# Patient Record
Sex: Female | Born: 1975 | Race: White | Hispanic: No | Marital: Married | Smoking: Former smoker
Health system: Southern US, Community
[De-identification: ages and names within clinical notes are randomized; demographics above are authoritative.]

## PROBLEM LIST (undated history)

## (undated) ENCOUNTER — Emergency Department (HOSPITAL_BASED_OUTPATIENT_CLINIC_OR_DEPARTMENT_OTHER): Admission: EM | Payer: BC Managed Care – PPO | Source: Home / Self Care

## (undated) DIAGNOSIS — F32A Depression, unspecified: Secondary | ICD-10-CM

## (undated) DIAGNOSIS — Z9889 Other specified postprocedural states: Secondary | ICD-10-CM

## (undated) DIAGNOSIS — F329 Major depressive disorder, single episode, unspecified: Secondary | ICD-10-CM

## (undated) HISTORY — PX: NOSE SURGERY: SHX723

## (undated) HISTORY — PX: CARPAL TUNNEL RELEASE: SHX101

## (undated) HISTORY — PX: BREAST REDUCTION SURGERY: SHX8

---

## 1998-06-06 ENCOUNTER — Other Ambulatory Visit: Admission: RE | Admit: 1998-06-06 | Discharge: 1998-06-06 | Payer: Self-pay | Admitting: *Deleted

## 1999-07-12 ENCOUNTER — Ambulatory Visit (HOSPITAL_COMMUNITY): Admission: RE | Admit: 1999-07-12 | Discharge: 1999-07-12 | Payer: Self-pay | Admitting: Family Medicine

## 1999-07-12 ENCOUNTER — Encounter: Payer: Self-pay | Admitting: Family Medicine

## 2000-07-23 ENCOUNTER — Other Ambulatory Visit: Admission: RE | Admit: 2000-07-23 | Discharge: 2000-07-23 | Payer: Self-pay | Admitting: *Deleted

## 2000-07-29 ENCOUNTER — Encounter: Admission: RE | Admit: 2000-07-29 | Discharge: 2000-07-29 | Payer: Self-pay | Admitting: *Deleted

## 2000-07-29 ENCOUNTER — Encounter: Payer: Self-pay | Admitting: *Deleted

## 2001-09-09 ENCOUNTER — Other Ambulatory Visit: Admission: RE | Admit: 2001-09-09 | Discharge: 2001-09-09 | Payer: Self-pay | Admitting: *Deleted

## 2002-05-09 ENCOUNTER — Ambulatory Visit (HOSPITAL_BASED_OUTPATIENT_CLINIC_OR_DEPARTMENT_OTHER): Admission: RE | Admit: 2002-05-09 | Discharge: 2002-05-10 | Payer: Self-pay | Admitting: Plastic Surgery

## 2002-05-09 ENCOUNTER — Encounter (INDEPENDENT_AMBULATORY_CARE_PROVIDER_SITE_OTHER): Payer: Self-pay | Admitting: *Deleted

## 2002-10-31 ENCOUNTER — Other Ambulatory Visit: Admission: RE | Admit: 2002-10-31 | Discharge: 2002-10-31 | Payer: Self-pay | Admitting: *Deleted

## 2003-08-17 ENCOUNTER — Ambulatory Visit (HOSPITAL_COMMUNITY): Admission: RE | Admit: 2003-08-17 | Discharge: 2003-08-17 | Payer: Self-pay | Admitting: Internal Medicine

## 2003-12-15 ENCOUNTER — Other Ambulatory Visit: Admission: RE | Admit: 2003-12-15 | Discharge: 2003-12-15 | Payer: Self-pay | Admitting: Obstetrics and Gynecology

## 2004-09-08 ENCOUNTER — Inpatient Hospital Stay (HOSPITAL_COMMUNITY): Admission: AD | Admit: 2004-09-08 | Discharge: 2004-09-10 | Payer: Self-pay | Admitting: Obstetrics and Gynecology

## 2005-02-17 ENCOUNTER — Other Ambulatory Visit: Admission: RE | Admit: 2005-02-17 | Discharge: 2005-02-17 | Payer: Self-pay | Admitting: Obstetrics and Gynecology

## 2007-11-12 ENCOUNTER — Other Ambulatory Visit: Admission: RE | Admit: 2007-11-12 | Discharge: 2007-11-12 | Payer: Self-pay | Admitting: Family Medicine

## 2010-09-20 NOTE — Op Note (Signed)
NAMEZOSIA, LUCCHESE                         ACCOUNT NO.:  [id number]   MEDICAL RECORD NO.:  [id number]                   PATIENT TYPE:  AMB   LOCATION:  DSC                                  FACILITY:  [facility]   PHYSICIAN:  Brantley Persons, M.D.             DATE OF BIRTH:  01/28/1976   DATE OF PROCEDURE:  05/09/2002  DATE OF DISCHARGE:                                 OPERATIVE REPORT   PREOPERATIVE DIAGNOSIS:  Bilateral macromastia.   POSTOPERATIVE DIAGNOSIS:  Bilateral macromastia.   OPERATION:  Bilateral reduction mammoplasty.   SURGEON:  Brantley Persons, M.D.   ASSISTANT:  Bing Neighbors. Clearance Coots, M.D.   ANESTHESIA:  General endotracheal   ANESTHESIOLOGIST:  Sheldon Silvan, M.D.   ESTIMATED BLOOD LOSS:  200 cc   FLUID REPLACEMENT:  2800 cc crystalloid.   URINE OUTPUT:  200 cc   COMPLICATIONS:  None   INDICATIONS FOR PROCEDURE:  The patient is a 35 year old Caucasian female  with bilateral macromastia which is essentially symptomatic. She complains  of neck aches, back aches, headaches, shoulder strap groove marks and pain  along with intertriginous skin rashes.  She presents to undergo bilateral  reduction mammoplasty.  She is to be taken for overnight stay for  progressive pain control along with ambulation and monitoring of the nipples  and breasts flaps.   DESCRIPTION OF PROCEDURE:  The patient was marked in the preoperative  holding area in a pattern of Wise for the future bilateral reduction  mammoplasties.  She was then taken back to the operating room and placed on  the table in the supine position.  After adequate general endotracheal  anesthesia was obtained, the patient's chest was prepped with Betadine and  alcohol and draped in a sterile fashion.  The base of the breasts were then  injected with 1% Lidocaine with epinephrine.  After adequate hemostasis had  taken effect, the procedure was begun.   First the left breast reduction was performed since this  was the much  smaller breast.  The nipple was marked with the 42 mm nipple marker.  The  skin was then incised around the nipple and de-epithelized down to the  inframammary crease in inferior pedicle pattern.  Next, the medial, superior  and lateral skin flaps were elevated were elevated down to the chest wall.  The excess fat and glandular tissue were removed from the inferior pedicle.  The nipple was then examined and found to be pink and viable.  The wound was  irrigated with saline irrigation.  Meticulous hemostasis was obtained with  the Bovie electrocautery.  The inferior pedicle was centralized using 3-0  Prolene suture.  A #10 JP flap fully fluted drain was then placed into the  wound.  The skin flaps were brought together and inverted T junction with 2-  0 Prolene suture.  The incision was then stapled for temporary closure.   Attention was then turned to  the right breast.  This was the much larger  breast.  The nipple was marked with 42 mm nipple marker.  The skin was then  incised around the nipple and de-epithelized down to inframammary crease in  inferior pedicle pattern.  Next the medial, superior and lateral skin flaps  were then elevated down to the chest wall.  The excess fat and glandular  tissue were removed from the inferior pedicle.  The nipple was then examined  and found to be pink and viable.  The wound was irrigated with saline  irrigation.  Meticulous hemostasis was obtained with the Bovie  electrocautery.  The inferior pedicle was centralized using 3-0 Prolene  suture.  A #10 JP flap fully fluted drain was then placed into the wound.  The skin flap was brought together in inverted T junction with a 2-0 Prolene  suture.  The incision was then stapled for temporary closure.   The breasts were then compared and found to have good shape and symmetry.  All the staples were removed and the incisions closed using 3-0 Monocryl in  the dermal layer followed by 4-0  Monocryl in an intracuticular stitch on the  skin.  The patient was then placed in the upright position.  The location of  the nipples was marked on each breast mound using the 38 mm nipple marker.  She was then placed back into the recumbent position.   First the future nipple areolar complex was incised on the left breast  mound.  The tissue was excised full thickness.  The nipple was then examined  and found to be pink and viable, brought out into this aperture and sewn in  place using 4-0 Monocryl in the dermal layer followed by 5-0 Monocryl in an  intracuticular stitch on the skin.  In a likewise fashion, the nipple  areolar complex was incised on the right breast mound.  This tissue was  excised full thickness.  The nipple was then examined and found to be pink  and viable and brought out into this aperture and sewn in place using 4-0  Monocryl in the dermal layer followed by 5-0 Monocryl in an intracuticular  stitch on the skin.  The JP drains were then sewn in place using 3-0 Nylon  suture.  The incisions were dressed with Benzoin and Steri-Strips and the  nipples additionally with Bacitracin ointment and Adaptic.  4 x 4s were then  placed over all the incisions and the patient was placed in a light  postoperative support bra.  There were no complications.  The patient  tolerated the procedure well.  The final needle and sponge counts were  reported to be correct at the end case.  The patient was then awakened from  general anesthesia and taken to the recovery room in stable condition. She  will be admitted overnight to the [facility] for progressive pain control along  with ambulation, monitoring of the nipples and breasts flaps.  Discharge is  planned for the morning.   AMOUNTS OF BREAST TISSUE REMOVED:  Right breast is 1313 grams.  Left breast  942 grams.                                              Brantley Persons, M.D.    MC/MEDQ  D:  05/09/2002  T:  05/09/2002  Job:   [id number]  cc:   Bing Neighbors. Clearance Coots, M.D.  [address]  [place]  [place] [address]  Fax: [phone]

## 2013-01-03 ENCOUNTER — Emergency Department (HOSPITAL_COMMUNITY)
Admission: EM | Admit: 2013-01-03 | Discharge: 2013-01-04 | Disposition: A | Discharge status: Home / Self Care | Payer: BC Managed Care – PPO | Attending: Emergency Medicine | Admitting: Emergency Medicine

## 2013-01-03 ENCOUNTER — Encounter (HOSPITAL_COMMUNITY): Payer: Self-pay

## 2013-01-03 DIAGNOSIS — IMO0002 Reserved for concepts with insufficient information to code with codable children: Secondary | ICD-10-CM | POA: Insufficient documentation

## 2013-01-03 DIAGNOSIS — Z87891 Personal history of nicotine dependence: Secondary | ICD-10-CM | POA: Insufficient documentation

## 2013-01-03 DIAGNOSIS — IMO0001 Reserved for inherently not codable concepts without codable children: Secondary | ICD-10-CM | POA: Insufficient documentation

## 2013-01-03 DIAGNOSIS — Z23 Encounter for immunization: Secondary | ICD-10-CM | POA: Insufficient documentation

## 2013-01-03 DIAGNOSIS — F329 Major depressive disorder, single episode, unspecified: Secondary | ICD-10-CM | POA: Insufficient documentation

## 2013-01-03 DIAGNOSIS — Y9289 Other specified places as the place of occurrence of the external cause: Secondary | ICD-10-CM | POA: Insufficient documentation

## 2013-01-03 DIAGNOSIS — Z79899 Other long term (current) drug therapy: Secondary | ICD-10-CM | POA: Insufficient documentation

## 2013-01-03 DIAGNOSIS — Z9889 Other specified postprocedural states: Secondary | ICD-10-CM | POA: Insufficient documentation

## 2013-01-03 DIAGNOSIS — Y9339 Activity, other involving climbing, rappelling and jumping off: Secondary | ICD-10-CM | POA: Insufficient documentation

## 2013-01-03 DIAGNOSIS — T148XXA Other injury of unspecified body region, initial encounter: Secondary | ICD-10-CM

## 2013-01-03 DIAGNOSIS — F3289 Other specified depressive episodes: Secondary | ICD-10-CM | POA: Insufficient documentation

## 2013-01-03 HISTORY — DX: Depression, unspecified: F32.A

## 2013-01-03 HISTORY — DX: Other specified postprocedural states: Z98.890

## 2013-01-03 HISTORY — DX: Major depressive disorder, single episode, unspecified: F32.9

## 2013-01-03 MED ORDER — RABIES VACCINE, PCEC IM SUSR
1.0000 mL | Freq: Once | INTRAMUSCULAR | Status: AC
  Administered 2013-01-04: 1 mL via INTRAMUSCULAR
  Filled 2013-01-03: qty 1

## 2013-01-03 MED ORDER — TETANUS-DIPHTH-ACELL PERTUSSIS 5-2.5-18.5 LF-MCG/0.5 IM SUSP
0.5000 mL | Freq: Once | INTRAMUSCULAR | Status: AC
  Administered 2013-01-04: 0.5 mL via INTRAMUSCULAR
  Filled 2013-01-03: qty 0.5

## 2013-01-03 MED ORDER — RABIES IMMUNE GLOBULIN 150 UNIT/ML IM INJ
20.0000 [IU]/kg | INJECTION | Freq: Once | INTRAMUSCULAR | Status: AC
  Administered 2013-01-04: 1875 [IU]
  Filled 2013-01-03: qty 12.5
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
## 2013-01-04 MED ORDER — FLUCONAZOLE 150 MG PO TABS: 150.0000 mg | ORAL_TABLET | Freq: Once | ORAL | Status: DC

## 2013-01-04 MED ORDER — AMOXICILLIN-POT CLAVULANATE 875-125 MG PO TABS: 1.0000 | ORAL_TABLET | Freq: Two times a day (BID) | ORAL | Status: DC

## 2013-01-05 NOTE — ED Provider Notes (Signed)
Medical screening examination/treatment/procedure(s) were performed by non-physician practitioner and as supervising physician I was immediately available for consultation/collaboration.   Richardean Canal, MD 01/05/13 [phone]

## 2013-01-07 ENCOUNTER — Encounter (HOSPITAL_COMMUNITY): Payer: Self-pay | Admitting: Emergency Medicine

## 2013-01-07 ENCOUNTER — Emergency Department (HOSPITAL_COMMUNITY)
Admission: EM | Admit: 2013-01-07 | Discharge: 2013-01-07 | Disposition: A | Discharge status: Home / Self Care | Payer: BC Managed Care – PPO | Source: Home / Self Care

## 2013-01-07 MED ORDER — RABIES VACCINE, PCEC IM SUSR
1.0000 mL | Freq: Once | INTRAMUSCULAR | Status: AC
  Administered 2013-01-07: 1 mL via INTRAMUSCULAR

## 2013-01-07 MED ORDER — RABIES VACCINE, PCEC IM SUSR
INTRAMUSCULAR | Status: AC
  Filled 2013-01-07: qty 1

## 2013-01-07 NOTE — ED Notes (Signed)
Pt here for rabies injection only. Pt voices no concerns.

## 2013-01-11 ENCOUNTER — Encounter (HOSPITAL_COMMUNITY): Payer: Self-pay | Admitting: Emergency Medicine

## 2013-01-11 ENCOUNTER — Emergency Department (HOSPITAL_COMMUNITY)
Admission: EM | Admit: 2013-01-11 | Discharge: 2013-01-11 | Disposition: A | Discharge status: Home / Self Care | Payer: BC Managed Care – PPO | Source: Home / Self Care

## 2013-01-11 DIAGNOSIS — Z203 Contact with and (suspected) exposure to rabies: Secondary | ICD-10-CM

## 2013-01-11 MED ORDER — RABIES VACCINE, PCEC IM SUSR
1.0000 mL | Freq: Once | INTRAMUSCULAR | Status: AC
  Administered 2013-01-11: 1 mL via INTRAMUSCULAR

## 2013-01-11 MED ORDER — RABIES VACCINE, PCEC IM SUSR
INTRAMUSCULAR | Status: AC
  Filled 2013-01-11: qty 1

## 2013-01-11 NOTE — ED Notes (Signed)
Patient in department for rabies

## 2013-01-19 ENCOUNTER — Encounter (HOSPITAL_COMMUNITY): Payer: Self-pay | Admitting: Emergency Medicine

## 2013-01-19 ENCOUNTER — Emergency Department (HOSPITAL_COMMUNITY)
Admission: EM | Admit: 2013-01-19 | Discharge: 2013-01-19 | Disposition: A | Discharge status: Home / Self Care | Payer: BC Managed Care – PPO | Source: Home / Self Care

## 2013-01-19 DIAGNOSIS — Z203 Contact with and (suspected) exposure to rabies: Secondary | ICD-10-CM

## 2013-01-19 MED ORDER — RABIES VACCINE, PCEC IM SUSR
1.0000 mL | Freq: Once | INTRAMUSCULAR | Status: AC
  Administered 2013-01-19: 1 mL via INTRAMUSCULAR

## 2013-01-19 MED ORDER — RABIES VACCINE, PCEC IM SUSR
INTRAMUSCULAR | Status: AC
  Filled 2013-01-19: qty 1

## 2013-01-19 NOTE — ED Notes (Signed)
Pt is here for 4th rabies vaccination Voices no new concerns Alert w/no signs of acute distress.  

## 2015-07-25 ENCOUNTER — Emergency Department (HOSPITAL_BASED_OUTPATIENT_CLINIC_OR_DEPARTMENT_OTHER)
Admission: EM | Admit: 2015-07-25 | Discharge: 2015-07-26 | Disposition: A | Discharge status: Home / Self Care | Payer: Self-pay | Attending: Emergency Medicine | Admitting: Emergency Medicine

## 2015-07-25 ENCOUNTER — Encounter (HOSPITAL_BASED_OUTPATIENT_CLINIC_OR_DEPARTMENT_OTHER): Payer: Self-pay | Admitting: Emergency Medicine

## 2015-07-25 DIAGNOSIS — M7071 Other bursitis of hip, right hip: Secondary | ICD-10-CM | POA: Insufficient documentation

## 2015-07-25 DIAGNOSIS — R911 Solitary pulmonary nodule: Secondary | ICD-10-CM | POA: Insufficient documentation

## 2015-07-25 DIAGNOSIS — K5900 Constipation, unspecified: Secondary | ICD-10-CM | POA: Insufficient documentation

## 2015-07-25 DIAGNOSIS — Y9389 Activity, other specified: Secondary | ICD-10-CM | POA: Insufficient documentation

## 2015-07-25 DIAGNOSIS — Z79899 Other long term (current) drug therapy: Secondary | ICD-10-CM | POA: Insufficient documentation

## 2015-07-25 DIAGNOSIS — F329 Major depressive disorder, single episode, unspecified: Secondary | ICD-10-CM | POA: Insufficient documentation

## 2015-07-25 DIAGNOSIS — Z3202 Encounter for pregnancy test, result negative: Secondary | ICD-10-CM | POA: Insufficient documentation

## 2015-07-25 DIAGNOSIS — K625 Hemorrhage of anus and rectum: Secondary | ICD-10-CM | POA: Insufficient documentation

## 2015-07-25 DIAGNOSIS — G8929 Other chronic pain: Secondary | ICD-10-CM | POA: Insufficient documentation

## 2015-07-25 DIAGNOSIS — Z87891 Personal history of nicotine dependence: Secondary | ICD-10-CM | POA: Insufficient documentation

## 2015-07-25 DIAGNOSIS — M719 Bursopathy, unspecified: Secondary | ICD-10-CM

## 2015-07-25 NOTE — ED Notes (Signed)
Pt was seen at [facility] with blood in stool tenderness in RLQ and was recommended that she been seen. Also reports pain in lower back area x several months.

## 2015-07-26 ENCOUNTER — Emergency Department (HOSPITAL_BASED_OUTPATIENT_CLINIC_OR_DEPARTMENT_OTHER): Payer: Self-pay

## 2015-07-26 ENCOUNTER — Encounter (HOSPITAL_BASED_OUTPATIENT_CLINIC_OR_DEPARTMENT_OTHER): Payer: Self-pay | Admitting: Emergency Medicine

## 2015-07-26 LAB — BASIC METABOLIC PANEL
ANION GAP: 12 (ref 5–15)
BUN: 12 mg/dL (ref 6–20)
CO2: 23 mmol/L (ref 22–32)
Calcium: 9 mg/dL (ref 8.9–10.3)
Chloride: 98 mmol/L — ABNORMAL LOW (ref 101–111)
Creatinine, Ser: 0.73 mg/dL (ref 0.44–1.00)
GLUCOSE: 100 mg/dL — AB (ref 65–99)
POTASSIUM: 3.4 mmol/L — AB (ref 3.5–5.1)
Sodium: 133 mmol/L — ABNORMAL LOW (ref 135–145)

## 2015-07-26 LAB — URINALYSIS, ROUTINE W REFLEX MICROSCOPIC
Bilirubin Urine: NEGATIVE
GLUCOSE, UA: NEGATIVE mg/dL
Hgb urine dipstick: NEGATIVE
KETONES UR: NEGATIVE mg/dL
LEUKOCYTES UA: NEGATIVE
NITRITE: NEGATIVE
PROTEIN: NEGATIVE mg/dL
Specific Gravity, Urine: 1.006 (ref 1.005–1.030)
pH: 6 (ref 5.0–8.0)

## 2015-07-26 LAB — CBC WITH DIFFERENTIAL/PLATELET
BASOS ABS: 0 10*3/uL (ref 0.0–0.1)
BASOS PCT: 0 %
Eosinophils Absolute: 0.2 10*3/uL (ref 0.0–0.7)
Eosinophils Relative: 1 %
HEMATOCRIT: 40.2 % (ref 36.0–46.0)
Hemoglobin: 13.5 g/dL (ref 12.0–15.0)
LYMPHS PCT: 30 %
Lymphs Abs: 4.3 10*3/uL — ABNORMAL HIGH (ref 0.7–4.0)
MCH: 28.4 pg (ref 26.0–34.0)
MCHC: 33.6 g/dL (ref 30.0–36.0)
MCV: 84.5 fL (ref 78.0–100.0)
Monocytes Absolute: 0.9 10*3/uL (ref 0.1–1.0)
Monocytes Relative: 7 %
NEUTROS ABS: 8.7 10*3/uL — AB (ref 1.7–7.7)
Neutrophils Relative %: 62 %
PLATELETS: 263 10*3/uL (ref 150–400)
RBC: 4.76 MIL/uL (ref 3.87–5.11)
RDW: 13.5 % (ref 11.5–15.5)
WBC: 14.2 10*3/uL — AB (ref 4.0–10.5)

## 2015-07-26 LAB — PREGNANCY, URINE: Preg Test, Ur: NEGATIVE

## 2015-07-26 LAB — OCCULT BLOOD X 1 CARD TO LAB, STOOL: FECAL OCCULT BLD: NEGATIVE

## 2015-07-26 MED ORDER — KETOROLAC TROMETHAMINE 30 MG/ML IJ SOLN
30.0000 mg | Freq: Once | INTRAMUSCULAR | Status: DC
  Filled 2015-07-26: qty 1

## 2015-07-26 MED ORDER — IOPAMIDOL (ISOVUE-300) INJECTION 61%
100.0000 mL | Freq: Once | INTRAVENOUS | Status: AC | PRN
  Administered 2015-07-26: 100 mL via INTRAVENOUS

## 2015-07-26 NOTE — ED Notes (Signed)
Pt c/o RLQ pain which is not new for her, but she had a bowel movement today that was mucous and maroon, which she has never had before.  She went to [facility] after hours and bc her WBC was elevated and she was having RLQ pain, they recommended that she come to the hospital for further work up.

## 2015-07-26 NOTE — ED Provider Notes (Signed)
CSN: [id number]     Arrival date & time 07/25/15  2209 History   First MD Initiated Contact with Patient 07/25/15 2352     Chief Complaint  Patient presents with  . Blood In Stools     (Consider location/radiation/quality/duration/timing/severity/associated sxs/prior Treatment) Patient is a 40 y.o. female presenting with hematochezia. The history is provided by the patient.  Rectal Bleeding Quality: a clot x1. 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 Depression   . Hx of breast reduction, elective    Past Surgical History  Procedure Laterality Date  . Breast reduction surgery    . Nose surgery     History reviewed. No pertinent family history. Social History  Substance Use Topics  . Smoking status: Former Games developermoker  . Smokeless tobacco: None  . Alcohol Use: 1.2 oz/week    2 Cans of beer per week     Comment: daily   OB History    No data available     Review of Systems  Constitutional: Negative for fever.  Gastrointestinal: Positive for constipation, hematochezia and anal bleeding. Negative for hematemesis.  Neurological: Negative for loss of consciousness.  All other systems reviewed and are negative.     Allergies  Sulfur  Home Medications   Prior to Admission medications   Medication Sig Start Date End Date Taking? Authorizing Provider  calcium carbonate (OS-CAL - DOSED IN MG OF ELEMENTAL CALCIUM) 1250 (500 Ca) MG tablet Take 1 tablet by mouth.   Yes Historical Provider, MD   ranitidine (ZANTAC) 75 MG tablet Take 75 mg by mouth 2 (two) times daily.   Yes Historical Provider, MD  ALPRAZolam Prudy Feeler(XANAX) 0.5 MG tablet Take 0.5 mg by mouth at bedtime as needed for sleep.    Historical Provider, MD  amoxicillin-clavulanate (AUGMENTIN) 875-125 MG per tablet Take 1 tablet by mouth 2 (two) times daily. 01/04/13   Heather Laisure, PA-C  Cyanocobalamin (B-12) 1000 MCG TBCR Take 1 tablet by mouth.    Historical Provider, MD  fluconazole (DIFLUCAN) 150 MG tablet Take 1 tablet (150 mg total) by mouth once. 01/04/13   Heather Laisure, PA-C  FLUoxetine (PROZAC) 40 MG capsule Take 40 mg by mouth daily.    Historical Provider, MD  Multiple Vitamins-Minerals (HAIR/SKIN/NAILS PO) Take 1 tablet by mouth daily.    Historical Provider, MD   BP 130/85 mmHg  Pulse 80  Temp(Src) 97.8 F (36.6 C) (Oral)  Resp 17  Ht 5\' 3"  (1.6 m)  Wt 216 lb (97.977 kg)  BMI 38.27 kg/m2  SpO2 99%  LMP 07/11/2015 Physical Exam  Constitutional: She is oriented to person, place, and time. She appears well-developed and well-nourished. No distress.  HENT:  Head: Normocephalic and atraumatic.  Mouth/Throat: Oropharynx is clear and moist.  Eyes: Conjunctivae are normal. Pupils are equal, round, and reactive to light.  Neck: Normal range of motion. Neck supple.  Cardiovascular: Normal rate, regular rhythm and intact distal pulses.   Pulmonary/Chest: Effort normal and breath sounds normal. No  respiratory distress. She has no wheezes. She has no rales.  Abdominal: Soft. Bowel sounds are normal. There is no tenderness. There is no rebound and no guarding.  Genitourinary: Guaiac negative stool.  Musculoskeletal: Normal range of motion.  Neurological: She is alert and oriented to person, place, and time.  Skin: Skin is warm and dry.  Psychiatric: She has a normal mood and affect.    ED Course  Procedures (including critical care time) Labs Review Labs Reviewed  URINALYSIS, ROUTINE W REFLEX MICROSCOPIC (NOT AT  [facility])  PREGNANCY, URINE  OCCULT BLOOD X 1 CARD TO LAB, STOOL  CBC WITH DIFFERENTIAL/PLATELET  BASIC METABOLIC PANEL  POC OCCULT BLOOD, ED    Imaging Review No results found. I have personally reviewed and evaluated these images and lab results as part of my medical decision-making.   EKG Interpretation None     Results for orders placed or performed during the hospital encounter of 07/25/15  CBC with Differential/Platelet  Result Value Ref Range   WBC 14.2 (H) 4.0 - 10.5 K/uL   RBC 4.76 3.87 - 5.11 MIL/uL   Hemoglobin 13.5 12.0 - 15.0 g/dL   HCT 96.0 45.4 - 09.8 %   MCV 84.5 78.0 - 100.0 fL   MCH 28.4 26.0 - 34.0 pg   MCHC 33.6 30.0 - 36.0 g/dL   RDW 11.9 14.7 - 82.9 %   Platelets 263 150 - 400 K/uL   Neutrophils Relative % 62 %   Neutro Abs 8.7 (H) 1.7 - 7.7 K/uL   Lymphocytes Relative 30 %   Lymphs Abs 4.3 (H) 0.7 - 4.0 K/uL   Monocytes Relative 7 %   Monocytes Absolute 0.9 0.1 - 1.0 K/uL   Eosinophils Relative 1 %   Eosinophils Absolute 0.2 0.0 - 0.7 K/uL   Basophils Relative 0 %   Basophils Absolute 0.0 0.0 - 0.1 K/uL  Basic metabolic panel  Result Value Ref Range   Sodium 133 (L) 135 - 145 mmol/L   Potassium 3.4 (L) 3.5 - 5.1 mmol/L   Chloride 98 (L) 101 - 111 mmol/L   CO2 23 22 - 32 mmol/L   Glucose, Bld 100 (H) 65 - 99 mg/dL   BUN 12 6 - 20 mg/dL   Creatinine, Ser 5.62 0.44 - 1.00 mg/dL   Calcium 9.0 8.9 - 13.0 mg/dL   GFR calc non Af Amer >60 >60 mL/min   GFR calc Af Amer >60 >60 mL/min   Anion gap 12 5 - 15  Urinalysis, Routine w reflex microscopic (not at [facility])  Result Value Ref Range   Color, Urine YELLOW YELLOW   APPearance CLEAR CLEAR   Specific Gravity, Urine 1.006 1.005 - 1.030   pH 6.0 5.0 - 8.0   Glucose, UA NEGATIVE NEGATIVE mg/dL   Hgb urine dipstick NEGATIVE NEGATIVE   Bilirubin Urine NEGATIVE NEGATIVE   Ketones, ur NEGATIVE NEGATIVE mg/dL   Protein, ur NEGATIVE NEGATIVE mg/dL   Nitrite NEGATIVE NEGATIVE   Leukocytes, UA NEGATIVE  NEGATIVE  Pregnancy, urine  Result Value Ref Range   Preg Test, Ur NEGATIVE NEGATIVE  Occult blood card to lab, stool  Result Value Ref Range   Fecal Occult Bld NEGATIVE NEGATIVE   No results found.   MDM   Final diagnoses:  None    Informed of CT findings and need for PMD to order CT chest in 6 months to assess pulmonary nodule stability.  Printed on discharge AVS.  Also pain could be referred from bursitis  in right hip.  Normal hemoglobin and Hemoccult is negative here but given reported clot passage will need outpatient follow up with GI for colonoscopy/endoscopy.  Please call to schedule this appointment.  Patient and husband verbalize understanding and agree to follow up    Virgel Haro, MD 07/26/15 1610

## 2015-07-26 NOTE — ED Notes (Signed)
Patient transported to CT 

## 2015-07-26 NOTE — ED Notes (Signed)
Pt verbalizes understanding of d/c instructions and denies any further needs at this time. 

## 2015-11-09 DIAGNOSIS — Z6838 Body mass index (BMI) 38.0-38.9, adult: Secondary | ICD-10-CM | POA: Diagnosis not present

## 2015-11-09 DIAGNOSIS — Z3202 Encounter for pregnancy test, result negative: Secondary | ICD-10-CM | POA: Diagnosis not present

## 2015-11-09 DIAGNOSIS — Z1231 Encounter for screening mammogram for malignant neoplasm of breast: Secondary | ICD-10-CM | POA: Diagnosis not present

## 2015-11-09 DIAGNOSIS — Z124 Encounter for screening for malignant neoplasm of cervix: Secondary | ICD-10-CM | POA: Diagnosis not present

## 2015-11-09 DIAGNOSIS — Z01419 Encounter for gynecological examination (general) (routine) without abnormal findings: Secondary | ICD-10-CM | POA: Diagnosis not present

## 2015-11-12 DIAGNOSIS — F331 Major depressive disorder, recurrent, moderate: Secondary | ICD-10-CM | POA: Diagnosis not present

## 2015-11-12 DIAGNOSIS — F411 Generalized anxiety disorder: Secondary | ICD-10-CM | POA: Diagnosis not present

## 2015-11-23 DIAGNOSIS — Z0000 Encounter for general adult medical examination without abnormal findings: Secondary | ICD-10-CM | POA: Diagnosis not present

## 2015-12-25 DIAGNOSIS — F3342 Major depressive disorder, recurrent, in full remission: Secondary | ICD-10-CM | POA: Diagnosis not present

## 2016-01-24 DIAGNOSIS — G5601 Carpal tunnel syndrome, right upper limb: Secondary | ICD-10-CM | POA: Diagnosis not present

## 2016-01-31 DIAGNOSIS — K651 Peritoneal abscess: Secondary | ICD-10-CM | POA: Diagnosis not present

## 2016-05-16 DIAGNOSIS — G5601 Carpal tunnel syndrome, right upper limb: Secondary | ICD-10-CM | POA: Diagnosis not present

## 2016-05-16 DIAGNOSIS — G5602 Carpal tunnel syndrome, left upper limb: Secondary | ICD-10-CM | POA: Diagnosis not present

## 2016-05-26 DIAGNOSIS — G5601 Carpal tunnel syndrome, right upper limb: Secondary | ICD-10-CM | POA: Diagnosis not present

## 2016-05-26 DIAGNOSIS — G5602 Carpal tunnel syndrome, left upper limb: Secondary | ICD-10-CM | POA: Diagnosis not present

## 2016-06-02 DIAGNOSIS — G5601 Carpal tunnel syndrome, right upper limb: Secondary | ICD-10-CM | POA: Diagnosis not present

## 2016-06-02 DIAGNOSIS — G5602 Carpal tunnel syndrome, left upper limb: Secondary | ICD-10-CM | POA: Diagnosis not present

## 2016-06-23 DIAGNOSIS — K210 Gastro-esophageal reflux disease with esophagitis: Secondary | ICD-10-CM | POA: Diagnosis not present

## 2016-06-24 DIAGNOSIS — F411 Generalized anxiety disorder: Secondary | ICD-10-CM | POA: Diagnosis not present

## 2016-06-24 DIAGNOSIS — F3342 Major depressive disorder, recurrent, in full remission: Secondary | ICD-10-CM | POA: Diagnosis not present

## 2016-06-30 DIAGNOSIS — G5601 Carpal tunnel syndrome, right upper limb: Secondary | ICD-10-CM | POA: Diagnosis not present

## 2016-06-30 DIAGNOSIS — G5602 Carpal tunnel syndrome, left upper limb: Secondary | ICD-10-CM | POA: Diagnosis not present

## 2016-07-23 DIAGNOSIS — G5602 Carpal tunnel syndrome, left upper limb: Secondary | ICD-10-CM | POA: Diagnosis not present

## 2016-07-23 DIAGNOSIS — G5603 Carpal tunnel syndrome, bilateral upper limbs: Secondary | ICD-10-CM | POA: Diagnosis not present

## 2016-07-23 DIAGNOSIS — G5601 Carpal tunnel syndrome, right upper limb: Secondary | ICD-10-CM | POA: Diagnosis not present

## 2016-12-03 DIAGNOSIS — Z01419 Encounter for gynecological examination (general) (routine) without abnormal findings: Secondary | ICD-10-CM | POA: Diagnosis not present

## 2016-12-03 DIAGNOSIS — Z113 Encounter for screening for infections with a predominantly sexual mode of transmission: Secondary | ICD-10-CM | POA: Diagnosis not present

## 2016-12-03 DIAGNOSIS — Z6841 Body Mass Index (BMI) 40.0 and over, adult: Secondary | ICD-10-CM | POA: Diagnosis not present

## 2016-12-03 DIAGNOSIS — Z124 Encounter for screening for malignant neoplasm of cervix: Secondary | ICD-10-CM | POA: Diagnosis not present

## 2016-12-03 DIAGNOSIS — Z1231 Encounter for screening mammogram for malignant neoplasm of breast: Secondary | ICD-10-CM | POA: Diagnosis not present

## 2016-12-23 DIAGNOSIS — F410 Panic disorder [episodic paroxysmal anxiety] without agoraphobia: Secondary | ICD-10-CM | POA: Diagnosis not present

## 2016-12-23 DIAGNOSIS — F3342 Major depressive disorder, recurrent, in full remission: Secondary | ICD-10-CM | POA: Diagnosis not present

## 2017-02-27 DIAGNOSIS — M2550 Pain in unspecified joint: Secondary | ICD-10-CM | POA: Diagnosis not present

## 2017-02-27 DIAGNOSIS — Z1329 Encounter for screening for other suspected endocrine disorder: Secondary | ICD-10-CM | POA: Diagnosis not present

## 2017-02-27 DIAGNOSIS — Z136 Encounter for screening for cardiovascular disorders: Secondary | ICD-10-CM | POA: Diagnosis not present

## 2017-02-27 DIAGNOSIS — E559 Vitamin D deficiency, unspecified: Secondary | ICD-10-CM | POA: Diagnosis not present

## 2017-02-27 DIAGNOSIS — M722 Plantar fascial fibromatosis: Secondary | ICD-10-CM | POA: Diagnosis not present

## 2017-02-27 DIAGNOSIS — Z131 Encounter for screening for diabetes mellitus: Secondary | ICD-10-CM | POA: Diagnosis not present

## 2017-02-27 DIAGNOSIS — Z0000 Encounter for general adult medical examination without abnormal findings: Secondary | ICD-10-CM | POA: Diagnosis not present

## 2017-03-26 ENCOUNTER — Emergency Department (HOSPITAL_BASED_OUTPATIENT_CLINIC_OR_DEPARTMENT_OTHER)
Admission: EM | Admit: 2017-03-26 | Discharge: 2017-03-26 | Disposition: A | Discharge status: Home / Self Care | Payer: BLUE CROSS/BLUE SHIELD | Attending: Physician Assistant | Admitting: Physician Assistant

## 2017-03-26 ENCOUNTER — Encounter (HOSPITAL_BASED_OUTPATIENT_CLINIC_OR_DEPARTMENT_OTHER): Payer: Self-pay

## 2017-03-26 ENCOUNTER — Other Ambulatory Visit: Payer: Self-pay

## 2017-03-26 DIAGNOSIS — Z87891 Personal history of nicotine dependence: Secondary | ICD-10-CM | POA: Diagnosis not present

## 2017-03-26 DIAGNOSIS — G43009 Migraine without aura, not intractable, without status migrainosus: Secondary | ICD-10-CM | POA: Insufficient documentation

## 2017-03-26 DIAGNOSIS — Z79899 Other long term (current) drug therapy: Secondary | ICD-10-CM | POA: Diagnosis not present

## 2017-03-26 DIAGNOSIS — R51 Headache: Secondary | ICD-10-CM | POA: Diagnosis not present

## 2017-03-26 MED ORDER — DIPHENHYDRAMINE HCL 25 MG PO CAPS
25.0000 mg | ORAL_CAPSULE | Freq: Once | ORAL | Status: AC
Start: 2017-03-26 — End: 2017-03-26
  Administered 2017-03-26: 25 mg via ORAL
  Filled 2017-03-26: qty 1

## 2017-03-26 MED ORDER — PROCHLORPERAZINE EDISYLATE 5 MG/ML IJ SOLN
10.0000 mg | Freq: Once | INTRAMUSCULAR | Status: AC
  Administered 2017-03-26: 10 mg via INTRAVENOUS
  Filled 2017-03-26: qty 2

## 2017-03-26 MED ORDER — KETOROLAC TROMETHAMINE 30 MG/ML IJ SOLN
15.0000 mg | Freq: Once | INTRAMUSCULAR | Status: AC
  Administered 2017-03-26: 15 mg via INTRAVENOUS
  Filled 2017-03-26: qty 1

## 2017-03-26 MED ORDER — SODIUM CHLORIDE 0.9 % IV BOLUS (SEPSIS)
1000.0000 mL | Freq: Once | INTRAVENOUS | Status: AC
  Administered 2017-03-26: 1000 mL via INTRAVENOUS

## 2017-03-26 MED ORDER — DIPHENHYDRAMINE HCL 50 MG/ML IJ SOLN
25.0000 mg | Freq: Once | INTRAMUSCULAR | Status: AC
  Administered 2017-03-26: 25 mg via INTRAVENOUS
  Filled 2017-03-26: qty 1

## 2017-03-26 NOTE — ED Provider Notes (Signed)
[facility] EMERGENCY DEPARTMENT Provider Note   CSN: [id number] Arrival date & time: 03/26/17  1905     History   Chief Complaint Chief Complaint  Patient presents with  . Headache    HPI Shivaun Bilello is a 41 y.o. female.  HPI   Patient is a 41 year old female presenting with headache.  Patient has history of migraines.  She has not seen anyone for them.  Today she had a migraine for the last week.  She reports mild sensitivity to sound and noise.  However patient has lights on, TV on.  Appears comfortable.  No neurologic changes.  No pregnancy, no trauma.  Past Medical History:  Diagnosis Date  . Depression   . Hx of breast reduction, elective     There are no active problems to display for this patient.   Past Surgical History:  Procedure Laterality Date  . BREAST REDUCTION SURGERY    . CARPAL TUNNEL RELEASE    . NOSE SURGERY      OB History    No data available       Home Medications    Prior to Admission medications   Medication Sig Start Date End Date Taking? Authorizing Provider  BuPROPion HCl (WELLBUTRIN PO) Take by mouth.   Yes [provider]  OMEPRAZOLE PO Take by mouth.   Yes [provider]  Vilazodone HCl (VIIBRYD PO) Take by mouth.   Yes [provider]  ALPRAZolam Prudy Feeler) 0.5 MG tablet Take 0.5 mg by mouth at bedtime as needed for sleep.    [provider]  Multiple Vitamins-Minerals (HAIR/SKIN/NAILS PO) Take 1 tablet by mouth daily.    [provider]    Family History No family history on file.  Social History Social History   Tobacco Use  . Smoking status: Former Games developer  . Smokeless tobacco: Never Used  Substance Use Topics  . Alcohol use: Yes    Comment: occ  . Drug use: No     Allergies   Sulfur   Review of Systems Review of Systems  Constitutional: Negative for activity change.  Respiratory: Negative for shortness of breath.   Cardiovascular: Negative  for chest pain.  Gastrointestinal: Negative for abdominal pain.  Neurological: Positive for headaches. Negative for seizures, speech difficulty, weakness and light-headedness.     Physical Exam Updated Vital Signs BP (!) 142/89 (BP Location: Left Arm)   Pulse 94   Temp 98.1 F (36.7 C) (Oral)   Resp 20   Ht  (1.626 m)   Wt 98.9 kg (218 lb)   LMP 03/12/2017   SpO2 100%   BMI 37.42 kg/m   Physical Exam  Constitutional: She is oriented to person, place, and time. She appears well-developed and well-nourished.  HENT:  Head: Normocephalic and atraumatic.  Eyes: Right eye exhibits no discharge.  Cardiovascular: Normal rate, regular rhythm and normal heart sounds.  No murmur heard. Pulmonary/Chest: Effort normal and breath sounds normal. She has no wheezes. She has no rales.  Abdominal: Soft. She exhibits no distension. There is no tenderness.  Neurological: She is alert and oriented to person, place, and time. She has normal strength. She is not disoriented. No cranial nerve deficit or sensory deficit. Coordination normal.  Skin: Skin is warm and dry. She is not diaphoretic.  Psychiatric: She has a normal mood and affect.  Nursing note and vitals reviewed.    ED Treatments / Results  Labs (all labs ordered are listed, but only abnormal  results are displayed) Labs Reviewed - No data to display  EKG  EKG Interpretation None       Radiology No results found.  Procedures Procedures (including critical care time)  Medications Ordered in ED Medications  prochlorperazine (COMPAZINE) injection 10 mg (10 mg Intravenous Given 03/26/17 2016)  diphenhydrAMINE (BENADRYL) capsule 25 mg (25 mg Oral Given 03/26/17 2016)  sodium chloride 0.9 % bolus 1,000 mL (1,000 mLs Intravenous New Bag/Given 03/26/17 2015)  ketorolac (TORADOL) 30 MG/ML injection 15 mg (15 mg Intravenous Given 03/26/17 2016)     Initial Impression / Assessment and Plan / ED Course  I have reviewed the  triage vital signs and the nursing notes.  Pertinent labs & imaging results that were available during my care of the patient were reviewed by me and considered in my medical decision making (see chart for details).     Patient is a 41 year old female presenting with headache.  Patient has history of migraines.  She has not seen anyone for them.  Today she had a migraine for the last week.  She reports mild sensitivity to sound and noise.  However patient has lights on, TV on.  Appears comfortable.  No neurologic changes.  No pregnancy, no trauma.  8:26 PM We will give migraine cocktail.  Mirgarine completely improved.  Will dc home.   Final Clinical Impressions(s) / ED Diagnoses   Final diagnoses:  None    ED Discharge Orders    None       Abelino DerrickMackuen, Cora Stetson Lyn, MD 03/26/17 2313

## 2017-03-26 NOTE — Discharge Instructions (Signed)
Please follow-up with neurology if you would like help controlling your headaches at home.

## 2017-03-26 NOTE — ED Triage Notes (Signed)
C/o HA x 1 week-NAD-stead gait

## 2017-04-06 DIAGNOSIS — G43B0 Ophthalmoplegic migraine, not intractable: Secondary | ICD-10-CM | POA: Diagnosis not present

## 2017-04-06 DIAGNOSIS — G514 Facial myokymia: Secondary | ICD-10-CM | POA: Diagnosis not present

## 2017-04-06 DIAGNOSIS — H5711 Ocular pain, right eye: Secondary | ICD-10-CM | POA: Diagnosis not present

## 2017-04-08 DIAGNOSIS — H5711 Ocular pain, right eye: Secondary | ICD-10-CM | POA: Diagnosis not present

## 2017-04-08 DIAGNOSIS — G43B0 Ophthalmoplegic migraine, not intractable: Secondary | ICD-10-CM | POA: Diagnosis not present

## 2017-05-28 DIAGNOSIS — M722 Plantar fascial fibromatosis: Secondary | ICD-10-CM | POA: Diagnosis not present

## 2017-06-10 DIAGNOSIS — M722 Plantar fascial fibromatosis: Secondary | ICD-10-CM | POA: Diagnosis not present

## 2017-06-10 DIAGNOSIS — M79671 Pain in right foot: Secondary | ICD-10-CM | POA: Diagnosis not present

## 2017-06-16 DIAGNOSIS — F3342 Major depressive disorder, recurrent, in full remission: Secondary | ICD-10-CM | POA: Diagnosis not present

## 2017-06-16 DIAGNOSIS — F410 Panic disorder [episodic paroxysmal anxiety] without agoraphobia: Secondary | ICD-10-CM | POA: Diagnosis not present

## 2018-06-24 DIAGNOSIS — F3342 Major depressive disorder, recurrent, in full remission: Secondary | ICD-10-CM | POA: Diagnosis not present

## 2018-07-16 DIAGNOSIS — Z6841 Body Mass Index (BMI) 40.0 and over, adult: Secondary | ICD-10-CM | POA: Diagnosis not present

## 2018-07-16 DIAGNOSIS — Z1231 Encounter for screening mammogram for malignant neoplasm of breast: Secondary | ICD-10-CM | POA: Diagnosis not present

## 2018-07-16 DIAGNOSIS — Z01419 Encounter for gynecological examination (general) (routine) without abnormal findings: Secondary | ICD-10-CM | POA: Diagnosis not present

## 2018-08-11 DIAGNOSIS — L680 Hirsutism: Secondary | ICD-10-CM | POA: Diagnosis not present

## 2018-09-14 DIAGNOSIS — I10 Essential (primary) hypertension: Secondary | ICD-10-CM | POA: Diagnosis not present

## 2018-09-14 DIAGNOSIS — G44209 Tension-type headache, unspecified, not intractable: Secondary | ICD-10-CM | POA: Diagnosis not present

## 2018-10-22 DIAGNOSIS — Z0000 Encounter for general adult medical examination without abnormal findings: Secondary | ICD-10-CM | POA: Diagnosis not present

## 2018-10-22 DIAGNOSIS — L680 Hirsutism: Secondary | ICD-10-CM | POA: Diagnosis not present

## 2018-11-02 DIAGNOSIS — L680 Hirsutism: Secondary | ICD-10-CM | POA: Diagnosis not present

## 2018-11-03 DIAGNOSIS — M25551 Pain in right hip: Secondary | ICD-10-CM | POA: Diagnosis not present

## 2018-11-03 DIAGNOSIS — M25511 Pain in right shoulder: Secondary | ICD-10-CM | POA: Diagnosis not present

## 2018-11-18 DIAGNOSIS — M25851 Other specified joint disorders, right hip: Secondary | ICD-10-CM | POA: Diagnosis not present

## 2018-11-18 DIAGNOSIS — M25551 Pain in right hip: Secondary | ICD-10-CM | POA: Diagnosis not present

## 2018-11-26 DIAGNOSIS — M25851 Other specified joint disorders, right hip: Secondary | ICD-10-CM | POA: Diagnosis not present

## 2018-12-16 DIAGNOSIS — F3342 Major depressive disorder, recurrent, in full remission: Secondary | ICD-10-CM | POA: Diagnosis not present

## 2018-12-16 DIAGNOSIS — F410 Panic disorder [episodic paroxysmal anxiety] without agoraphobia: Secondary | ICD-10-CM | POA: Diagnosis not present

## 2019-01-05 DIAGNOSIS — R21 Rash and other nonspecific skin eruption: Secondary | ICD-10-CM | POA: Diagnosis not present

## 2019-03-22 ENCOUNTER — Other Ambulatory Visit: Payer: Self-pay

## 2019-03-22 DIAGNOSIS — Z20822 Contact with and (suspected) exposure to covid-19: Secondary | ICD-10-CM

## 2019-03-24 LAB — NOVEL CORONAVIRUS, NAA: SARS-CoV-2, NAA: NOT DETECTED

## 2019-05-12 ENCOUNTER — Ambulatory Visit: Payer: BLUE CROSS/BLUE SHIELD | Attending: Internal Medicine

## 2019-05-12 DIAGNOSIS — Z20822 Contact with and (suspected) exposure to covid-19: Secondary | ICD-10-CM | POA: Insufficient documentation

## 2019-05-13 ENCOUNTER — Ambulatory Visit: Payer: Self-pay | Attending: Internal Medicine

## 2019-05-14 LAB — NOVEL CORONAVIRUS, NAA: SARS-CoV-2, NAA: NOT DETECTED

## 2019-06-27 DIAGNOSIS — F3342 Major depressive disorder, recurrent, in full remission: Secondary | ICD-10-CM | POA: Diagnosis not present

## 2019-09-20 DIAGNOSIS — G8929 Other chronic pain: Secondary | ICD-10-CM | POA: Diagnosis not present

## 2019-09-20 DIAGNOSIS — M5442 Lumbago with sciatica, left side: Secondary | ICD-10-CM | POA: Diagnosis not present

## 2019-09-27 ENCOUNTER — Ambulatory Visit
Admission: RE | Admit: 2019-09-27 | Discharge: 2019-09-27 | Disposition: A | Discharge status: Home / Self Care | Payer: BLUE CROSS/BLUE SHIELD | Source: Ambulatory Visit | Attending: Family Medicine | Admitting: Family Medicine

## 2019-09-27 ENCOUNTER — Other Ambulatory Visit: Payer: Self-pay | Admitting: Family Medicine

## 2019-09-27 DIAGNOSIS — M545 Low back pain: Secondary | ICD-10-CM | POA: Diagnosis not present

## 2019-09-27 DIAGNOSIS — M5442 Lumbago with sciatica, left side: Secondary | ICD-10-CM

## 2019-12-07 DIAGNOSIS — F4323 Adjustment disorder with mixed anxiety and depressed mood: Secondary | ICD-10-CM | POA: Diagnosis not present

## 2019-12-20 DIAGNOSIS — R519 Headache, unspecified: Secondary | ICD-10-CM | POA: Diagnosis not present

## 2019-12-20 DIAGNOSIS — G8929 Other chronic pain: Secondary | ICD-10-CM | POA: Diagnosis not present

## 2019-12-20 DIAGNOSIS — I10 Essential (primary) hypertension: Secondary | ICD-10-CM | POA: Diagnosis not present

## 2020-01-05 DIAGNOSIS — Z01419 Encounter for gynecological examination (general) (routine) without abnormal findings: Secondary | ICD-10-CM | POA: Diagnosis not present

## 2020-01-05 DIAGNOSIS — Z6839 Body mass index (BMI) 39.0-39.9, adult: Secondary | ICD-10-CM | POA: Diagnosis not present

## 2020-01-05 DIAGNOSIS — Z1231 Encounter for screening mammogram for malignant neoplasm of breast: Secondary | ICD-10-CM | POA: Diagnosis not present

## 2020-01-11 DIAGNOSIS — F4321 Adjustment disorder with depressed mood: Secondary | ICD-10-CM | POA: Diagnosis not present

## 2020-01-11 DIAGNOSIS — F3342 Major depressive disorder, recurrent, in full remission: Secondary | ICD-10-CM | POA: Diagnosis not present

## 2020-01-11 DIAGNOSIS — F410 Panic disorder [episodic paroxysmal anxiety] without agoraphobia: Secondary | ICD-10-CM | POA: Diagnosis not present

## 2020-01-11 DIAGNOSIS — F411 Generalized anxiety disorder: Secondary | ICD-10-CM | POA: Diagnosis not present

## 2020-01-16 DIAGNOSIS — Z20822 Contact with and (suspected) exposure to covid-19: Secondary | ICD-10-CM | POA: Diagnosis not present

## 2020-03-06 DIAGNOSIS — M25851 Other specified joint disorders, right hip: Secondary | ICD-10-CM | POA: Diagnosis not present

## 2020-03-28 DIAGNOSIS — M25551 Pain in right hip: Secondary | ICD-10-CM | POA: Diagnosis not present

## 2020-04-17 DIAGNOSIS — Z0000 Encounter for general adult medical examination without abnormal findings: Secondary | ICD-10-CM | POA: Diagnosis not present

## 2020-04-24 ENCOUNTER — Other Ambulatory Visit: Payer: Self-pay | Admitting: Obstetrics and Gynecology

## 2020-04-24 ENCOUNTER — Other Ambulatory Visit: Payer: Self-pay

## 2020-04-24 DIAGNOSIS — Z6837 Body mass index (BMI) 37.0-37.9, adult: Secondary | ICD-10-CM | POA: Diagnosis not present

## 2020-04-24 DIAGNOSIS — N946 Dysmenorrhea, unspecified: Secondary | ICD-10-CM | POA: Diagnosis not present

## 2020-04-24 DIAGNOSIS — N945 Secondary dysmenorrhea: Secondary | ICD-10-CM | POA: Diagnosis not present

## 2020-05-15 ENCOUNTER — Ambulatory Visit
Admission: RE | Admit: 2020-05-15 | Discharge: 2020-05-15 | Disposition: A | Discharge status: Home / Self Care | Payer: BLUE CROSS/BLUE SHIELD | Source: Ambulatory Visit | Attending: Obstetrics and Gynecology | Admitting: Obstetrics and Gynecology

## 2020-05-15 DIAGNOSIS — N946 Dysmenorrhea, unspecified: Secondary | ICD-10-CM | POA: Diagnosis not present

## 2020-05-15 DIAGNOSIS — N83202 Unspecified ovarian cyst, left side: Secondary | ICD-10-CM | POA: Diagnosis not present

## 2020-06-13 DIAGNOSIS — M7061 Trochanteric bursitis, right hip: Secondary | ICD-10-CM | POA: Diagnosis not present

## 2020-06-13 DIAGNOSIS — M25851 Other specified joint disorders, right hip: Secondary | ICD-10-CM | POA: Diagnosis not present

## 2020-07-03 DIAGNOSIS — F410 Panic disorder [episodic paroxysmal anxiety] without agoraphobia: Secondary | ICD-10-CM | POA: Diagnosis not present

## 2020-07-03 DIAGNOSIS — F3342 Major depressive disorder, recurrent, in full remission: Secondary | ICD-10-CM | POA: Diagnosis not present

## 2020-08-01 DIAGNOSIS — M25851 Other specified joint disorders, right hip: Secondary | ICD-10-CM | POA: Diagnosis not present

## 2020-08-01 DIAGNOSIS — M7061 Trochanteric bursitis, right hip: Secondary | ICD-10-CM | POA: Diagnosis not present

## 2020-09-28 DIAGNOSIS — R197 Diarrhea, unspecified: Secondary | ICD-10-CM | POA: Diagnosis not present

## 2021-01-01 DIAGNOSIS — F331 Major depressive disorder, recurrent, moderate: Secondary | ICD-10-CM | POA: Diagnosis not present

## 2021-01-01 DIAGNOSIS — G4700 Insomnia, unspecified: Secondary | ICD-10-CM | POA: Diagnosis not present

## 2021-01-01 DIAGNOSIS — F411 Generalized anxiety disorder: Secondary | ICD-10-CM | POA: Diagnosis not present

## 2021-05-13 DIAGNOSIS — F411 Generalized anxiety disorder: Secondary | ICD-10-CM | POA: Diagnosis not present

## 2021-05-13 DIAGNOSIS — F331 Major depressive disorder, recurrent, moderate: Secondary | ICD-10-CM | POA: Diagnosis not present

## 2021-05-20 DIAGNOSIS — F331 Major depressive disorder, recurrent, moderate: Secondary | ICD-10-CM | POA: Diagnosis not present

## 2021-05-20 DIAGNOSIS — F411 Generalized anxiety disorder: Secondary | ICD-10-CM | POA: Diagnosis not present

## 2021-05-24 DIAGNOSIS — F331 Major depressive disorder, recurrent, moderate: Secondary | ICD-10-CM | POA: Diagnosis not present

## 2021-05-24 DIAGNOSIS — F411 Generalized anxiety disorder: Secondary | ICD-10-CM | POA: Diagnosis not present

## 2021-06-04 DIAGNOSIS — Z0000 Encounter for general adult medical examination without abnormal findings: Secondary | ICD-10-CM | POA: Diagnosis not present

## 2021-06-04 DIAGNOSIS — D509 Iron deficiency anemia, unspecified: Secondary | ICD-10-CM | POA: Diagnosis not present

## 2021-06-04 DIAGNOSIS — R519 Headache, unspecified: Secondary | ICD-10-CM | POA: Diagnosis not present

## 2021-06-04 DIAGNOSIS — Z131 Encounter for screening for diabetes mellitus: Secondary | ICD-10-CM | POA: Diagnosis not present

## 2021-06-04 DIAGNOSIS — R5383 Other fatigue: Secondary | ICD-10-CM | POA: Diagnosis not present

## 2021-06-04 DIAGNOSIS — I10 Essential (primary) hypertension: Secondary | ICD-10-CM | POA: Diagnosis not present

## 2021-06-04 DIAGNOSIS — L509 Urticaria, unspecified: Secondary | ICD-10-CM | POA: Diagnosis not present

## 2021-06-05 DIAGNOSIS — F331 Major depressive disorder, recurrent, moderate: Secondary | ICD-10-CM | POA: Diagnosis not present

## 2021-06-05 DIAGNOSIS — F411 Generalized anxiety disorder: Secondary | ICD-10-CM | POA: Diagnosis not present

## 2021-06-19 DIAGNOSIS — F331 Major depressive disorder, recurrent, moderate: Secondary | ICD-10-CM | POA: Diagnosis not present

## 2021-06-19 DIAGNOSIS — F411 Generalized anxiety disorder: Secondary | ICD-10-CM | POA: Diagnosis not present

## 2021-07-03 DIAGNOSIS — F331 Major depressive disorder, recurrent, moderate: Secondary | ICD-10-CM | POA: Diagnosis not present

## 2021-07-03 DIAGNOSIS — F411 Generalized anxiety disorder: Secondary | ICD-10-CM | POA: Diagnosis not present

## 2021-07-09 DIAGNOSIS — Z01419 Encounter for gynecological examination (general) (routine) without abnormal findings: Secondary | ICD-10-CM | POA: Diagnosis not present

## 2021-07-09 DIAGNOSIS — Z6838 Body mass index (BMI) 38.0-38.9, adult: Secondary | ICD-10-CM | POA: Diagnosis not present

## 2021-07-09 DIAGNOSIS — Z1231 Encounter for screening mammogram for malignant neoplasm of breast: Secondary | ICD-10-CM | POA: Diagnosis not present

## 2021-07-09 DIAGNOSIS — Z124 Encounter for screening for malignant neoplasm of cervix: Secondary | ICD-10-CM | POA: Diagnosis not present

## 2021-07-11 ENCOUNTER — Other Ambulatory Visit: Payer: Self-pay | Admitting: Obstetrics and Gynecology

## 2021-07-11 DIAGNOSIS — R928 Other abnormal and inconclusive findings on diagnostic imaging of breast: Secondary | ICD-10-CM

## 2021-07-29 DIAGNOSIS — F331 Major depressive disorder, recurrent, moderate: Secondary | ICD-10-CM | POA: Diagnosis not present

## 2021-07-29 DIAGNOSIS — F411 Generalized anxiety disorder: Secondary | ICD-10-CM | POA: Diagnosis not present

## 2021-08-01 ENCOUNTER — Other Ambulatory Visit: Payer: Self-pay | Admitting: Obstetrics and Gynecology

## 2021-08-01 ENCOUNTER — Ambulatory Visit
Admission: RE | Admit: 2021-08-01 | Discharge: 2021-08-01 | Disposition: A | Discharge status: Home / Self Care | Payer: BLUE CROSS/BLUE SHIELD | Source: Ambulatory Visit | Attending: Obstetrics and Gynecology | Admitting: Obstetrics and Gynecology

## 2021-08-01 DIAGNOSIS — N6320 Unspecified lump in the left breast, unspecified quadrant: Secondary | ICD-10-CM

## 2021-08-01 DIAGNOSIS — R922 Inconclusive mammogram: Secondary | ICD-10-CM | POA: Diagnosis not present

## 2021-08-01 DIAGNOSIS — R928 Other abnormal and inconclusive findings on diagnostic imaging of breast: Secondary | ICD-10-CM

## 2021-08-09 ENCOUNTER — Ambulatory Visit
Admission: RE | Admit: 2021-08-09 | Discharge: 2021-08-09 | Disposition: A | Discharge status: Home / Self Care | Payer: BLUE CROSS/BLUE SHIELD | Source: Ambulatory Visit | Attending: Obstetrics and Gynecology | Admitting: Obstetrics and Gynecology

## 2021-08-09 DIAGNOSIS — N6323 Unspecified lump in the left breast, lower outer quadrant: Secondary | ICD-10-CM | POA: Diagnosis not present

## 2021-08-09 DIAGNOSIS — N6320 Unspecified lump in the left breast, unspecified quadrant: Secondary | ICD-10-CM

## 2021-08-09 DIAGNOSIS — N6012 Diffuse cystic mastopathy of left breast: Secondary | ICD-10-CM | POA: Diagnosis not present

## 2021-08-15 DIAGNOSIS — F411 Generalized anxiety disorder: Secondary | ICD-10-CM | POA: Diagnosis not present

## 2021-08-15 DIAGNOSIS — F331 Major depressive disorder, recurrent, moderate: Secondary | ICD-10-CM | POA: Diagnosis not present

## 2021-08-16 ENCOUNTER — Other Ambulatory Visit: Payer: BLUE CROSS/BLUE SHIELD

## 2021-08-29 DIAGNOSIS — M25851 Other specified joint disorders, right hip: Secondary | ICD-10-CM | POA: Diagnosis not present

## 2021-08-29 DIAGNOSIS — M7061 Trochanteric bursitis, right hip: Secondary | ICD-10-CM | POA: Diagnosis not present

## 2021-09-07 DIAGNOSIS — F331 Major depressive disorder, recurrent, moderate: Secondary | ICD-10-CM | POA: Diagnosis not present

## 2021-09-07 DIAGNOSIS — F411 Generalized anxiety disorder: Secondary | ICD-10-CM | POA: Diagnosis not present

## 2021-09-16 DIAGNOSIS — F331 Major depressive disorder, recurrent, moderate: Secondary | ICD-10-CM | POA: Diagnosis not present

## 2021-09-16 DIAGNOSIS — F411 Generalized anxiety disorder: Secondary | ICD-10-CM | POA: Diagnosis not present

## 2021-09-16 DIAGNOSIS — F3281 Premenstrual dysphoric disorder: Secondary | ICD-10-CM | POA: Diagnosis not present

## 2021-09-19 DIAGNOSIS — F411 Generalized anxiety disorder: Secondary | ICD-10-CM | POA: Diagnosis not present

## 2021-09-19 DIAGNOSIS — F3281 Premenstrual dysphoric disorder: Secondary | ICD-10-CM | POA: Diagnosis not present

## 2021-09-19 DIAGNOSIS — F331 Major depressive disorder, recurrent, moderate: Secondary | ICD-10-CM | POA: Diagnosis not present

## 2021-10-03 DIAGNOSIS — F331 Major depressive disorder, recurrent, moderate: Secondary | ICD-10-CM | POA: Diagnosis not present

## 2021-10-03 DIAGNOSIS — F411 Generalized anxiety disorder: Secondary | ICD-10-CM | POA: Diagnosis not present

## 2021-10-11 DIAGNOSIS — L659 Nonscarring hair loss, unspecified: Secondary | ICD-10-CM | POA: Diagnosis not present

## 2021-10-11 DIAGNOSIS — E559 Vitamin D deficiency, unspecified: Secondary | ICD-10-CM | POA: Diagnosis not present

## 2021-10-11 DIAGNOSIS — E282 Polycystic ovarian syndrome: Secondary | ICD-10-CM | POA: Diagnosis not present

## 2021-10-11 DIAGNOSIS — N943 Premenstrual tension syndrome: Secondary | ICD-10-CM | POA: Diagnosis not present

## 2021-10-11 DIAGNOSIS — N926 Irregular menstruation, unspecified: Secondary | ICD-10-CM | POA: Diagnosis not present

## 2021-10-11 DIAGNOSIS — L682 Localized hypertrichosis: Secondary | ICD-10-CM | POA: Diagnosis not present

## 2021-10-14 DIAGNOSIS — F411 Generalized anxiety disorder: Secondary | ICD-10-CM | POA: Diagnosis not present

## 2021-10-14 DIAGNOSIS — F331 Major depressive disorder, recurrent, moderate: Secondary | ICD-10-CM | POA: Diagnosis not present

## 2021-10-23 DIAGNOSIS — M5431 Sciatica, right side: Secondary | ICD-10-CM | POA: Diagnosis not present

## 2021-10-23 DIAGNOSIS — M25851 Other specified joint disorders, right hip: Secondary | ICD-10-CM | POA: Diagnosis not present

## 2021-10-23 DIAGNOSIS — M7061 Trochanteric bursitis, right hip: Secondary | ICD-10-CM | POA: Diagnosis not present

## 2021-11-08 DIAGNOSIS — F411 Generalized anxiety disorder: Secondary | ICD-10-CM | POA: Diagnosis not present

## 2021-11-08 DIAGNOSIS — F331 Major depressive disorder, recurrent, moderate: Secondary | ICD-10-CM | POA: Diagnosis not present

## 2021-12-05 DIAGNOSIS — F411 Generalized anxiety disorder: Secondary | ICD-10-CM | POA: Diagnosis not present

## 2021-12-05 DIAGNOSIS — F331 Major depressive disorder, recurrent, moderate: Secondary | ICD-10-CM | POA: Diagnosis not present

## 2021-12-17 DIAGNOSIS — F3342 Major depressive disorder, recurrent, in full remission: Secondary | ICD-10-CM | POA: Diagnosis not present

## 2021-12-17 DIAGNOSIS — F5101 Primary insomnia: Secondary | ICD-10-CM | POA: Diagnosis not present

## 2021-12-17 DIAGNOSIS — F411 Generalized anxiety disorder: Secondary | ICD-10-CM | POA: Diagnosis not present

## 2022-01-20 DIAGNOSIS — H9201 Otalgia, right ear: Secondary | ICD-10-CM | POA: Diagnosis not present

## 2022-02-24 DIAGNOSIS — M545 Low back pain, unspecified: Secondary | ICD-10-CM | POA: Diagnosis not present

## 2022-05-02 DIAGNOSIS — Z03818 Encounter for observation for suspected exposure to other biological agents ruled out: Secondary | ICD-10-CM | POA: Diagnosis not present

## 2022-05-02 DIAGNOSIS — J029 Acute pharyngitis, unspecified: Secondary | ICD-10-CM | POA: Diagnosis not present

## 2022-06-02 DIAGNOSIS — N939 Abnormal uterine and vaginal bleeding, unspecified: Secondary | ICD-10-CM | POA: Diagnosis not present

## 2022-06-02 DIAGNOSIS — N920 Excessive and frequent menstruation with regular cycle: Secondary | ICD-10-CM | POA: Diagnosis not present

## 2022-06-16 DIAGNOSIS — N920 Excessive and frequent menstruation with regular cycle: Secondary | ICD-10-CM | POA: Diagnosis not present

## 2022-06-16 DIAGNOSIS — Z3202 Encounter for pregnancy test, result negative: Secondary | ICD-10-CM | POA: Diagnosis not present

## 2022-08-11 ENCOUNTER — Other Ambulatory Visit: Payer: Self-pay | Admitting: Family Medicine

## 2022-08-11 DIAGNOSIS — Z0000 Encounter for general adult medical examination without abnormal findings: Secondary | ICD-10-CM | POA: Diagnosis not present

## 2022-08-11 DIAGNOSIS — Z8249 Family history of ischemic heart disease and other diseases of the circulatory system: Secondary | ICD-10-CM

## 2022-08-11 DIAGNOSIS — E559 Vitamin D deficiency, unspecified: Secondary | ICD-10-CM | POA: Diagnosis not present

## 2022-08-11 DIAGNOSIS — Z23 Encounter for immunization: Secondary | ICD-10-CM | POA: Diagnosis not present

## 2022-08-11 DIAGNOSIS — I10 Essential (primary) hypertension: Secondary | ICD-10-CM | POA: Diagnosis not present

## 2022-09-18 ENCOUNTER — Other Ambulatory Visit: Payer: BLUE CROSS/BLUE SHIELD

## 2022-09-26 DIAGNOSIS — F331 Major depressive disorder, recurrent, moderate: Secondary | ICD-10-CM | POA: Diagnosis not present

## 2022-09-26 DIAGNOSIS — F411 Generalized anxiety disorder: Secondary | ICD-10-CM | POA: Diagnosis not present

## 2022-09-30 DIAGNOSIS — R070 Pain in throat: Secondary | ICD-10-CM | POA: Diagnosis not present

## 2022-09-30 DIAGNOSIS — R051 Acute cough: Secondary | ICD-10-CM | POA: Diagnosis not present

## 2022-09-30 DIAGNOSIS — J020 Streptococcal pharyngitis: Secondary | ICD-10-CM | POA: Diagnosis not present

## 2022-11-05 DIAGNOSIS — L309 Dermatitis, unspecified: Secondary | ICD-10-CM | POA: Diagnosis not present

## 2022-12-11 DIAGNOSIS — F411 Generalized anxiety disorder: Secondary | ICD-10-CM | POA: Diagnosis not present

## 2022-12-11 DIAGNOSIS — F5101 Primary insomnia: Secondary | ICD-10-CM | POA: Diagnosis not present

## 2022-12-11 DIAGNOSIS — F3341 Major depressive disorder, recurrent, in partial remission: Secondary | ICD-10-CM | POA: Diagnosis not present

## 2023-01-15 DIAGNOSIS — L218 Other seborrheic dermatitis: Secondary | ICD-10-CM | POA: Diagnosis not present

## 2023-01-15 DIAGNOSIS — D229 Melanocytic nevi, unspecified: Secondary | ICD-10-CM | POA: Diagnosis not present

## 2023-02-11 DIAGNOSIS — Z124 Encounter for screening for malignant neoplasm of cervix: Secondary | ICD-10-CM | POA: Diagnosis not present

## 2023-02-11 DIAGNOSIS — Z1231 Encounter for screening mammogram for malignant neoplasm of breast: Secondary | ICD-10-CM | POA: Diagnosis not present

## 2023-02-11 DIAGNOSIS — Z1331 Encounter for screening for depression: Secondary | ICD-10-CM | POA: Diagnosis not present

## 2023-02-11 DIAGNOSIS — Z01419 Encounter for gynecological examination (general) (routine) without abnormal findings: Secondary | ICD-10-CM | POA: Diagnosis not present

## 2023-05-05 DIAGNOSIS — R1084 Generalized abdominal pain: Secondary | ICD-10-CM | POA: Diagnosis not present

## 2023-05-05 DIAGNOSIS — R197 Diarrhea, unspecified: Secondary | ICD-10-CM | POA: Diagnosis not present

## 2023-06-02 DIAGNOSIS — Z23 Encounter for immunization: Secondary | ICD-10-CM | POA: Diagnosis not present

## 2023-06-02 DIAGNOSIS — R000 Tachycardia, unspecified: Secondary | ICD-10-CM | POA: Diagnosis not present

## 2023-06-02 DIAGNOSIS — Z1211 Encounter for screening for malignant neoplasm of colon: Secondary | ICD-10-CM | POA: Diagnosis not present

## 2023-06-24 DIAGNOSIS — I10 Essential (primary) hypertension: Secondary | ICD-10-CM | POA: Diagnosis not present

## 2023-06-24 DIAGNOSIS — R000 Tachycardia, unspecified: Secondary | ICD-10-CM | POA: Diagnosis not present

## 2023-07-21 DIAGNOSIS — R000 Tachycardia, unspecified: Secondary | ICD-10-CM | POA: Diagnosis not present

## 2023-07-21 DIAGNOSIS — F411 Generalized anxiety disorder: Secondary | ICD-10-CM | POA: Diagnosis not present

## 2023-07-21 DIAGNOSIS — F5101 Primary insomnia: Secondary | ICD-10-CM | POA: Diagnosis not present

## 2023-07-21 DIAGNOSIS — I10 Essential (primary) hypertension: Secondary | ICD-10-CM | POA: Diagnosis not present

## 2023-07-21 DIAGNOSIS — F3342 Major depressive disorder, recurrent, in full remission: Secondary | ICD-10-CM | POA: Diagnosis not present

## 2023-08-28 DIAGNOSIS — Z1211 Encounter for screening for malignant neoplasm of colon: Secondary | ICD-10-CM | POA: Diagnosis not present

## 2023-08-28 DIAGNOSIS — K635 Polyp of colon: Secondary | ICD-10-CM | POA: Diagnosis not present

## 2023-08-28 DIAGNOSIS — D122 Benign neoplasm of ascending colon: Secondary | ICD-10-CM | POA: Diagnosis not present

## 2023-09-01 DIAGNOSIS — M542 Cervicalgia: Secondary | ICD-10-CM | POA: Diagnosis not present

## 2023-09-03 DIAGNOSIS — R350 Frequency of micturition: Secondary | ICD-10-CM | POA: Diagnosis not present

## 2023-09-13 DIAGNOSIS — J029 Acute pharyngitis, unspecified: Secondary | ICD-10-CM | POA: Diagnosis not present

## 2023-09-13 DIAGNOSIS — H6691 Otitis media, unspecified, right ear: Secondary | ICD-10-CM | POA: Diagnosis not present

## 2023-09-13 DIAGNOSIS — J0100 Acute maxillary sinusitis, unspecified: Secondary | ICD-10-CM | POA: Diagnosis not present

## 2023-12-10 DIAGNOSIS — M25511 Pain in right shoulder: Secondary | ICD-10-CM | POA: Diagnosis not present

## 2023-12-10 DIAGNOSIS — G8929 Other chronic pain: Secondary | ICD-10-CM | POA: Diagnosis not present

## 2023-12-10 DIAGNOSIS — M5412 Radiculopathy, cervical region: Secondary | ICD-10-CM | POA: Diagnosis not present

## 2023-12-10 DIAGNOSIS — I10 Essential (primary) hypertension: Secondary | ICD-10-CM | POA: Diagnosis not present

## 2024-01-15 DIAGNOSIS — F411 Generalized anxiety disorder: Secondary | ICD-10-CM | POA: Diagnosis not present

## 2024-01-15 DIAGNOSIS — F5101 Primary insomnia: Secondary | ICD-10-CM | POA: Diagnosis not present

## 2024-01-15 DIAGNOSIS — F3342 Major depressive disorder, recurrent, in full remission: Secondary | ICD-10-CM | POA: Diagnosis not present

## 2024-03-05 IMAGING — MG MM BREAST LOCALIZATION CLIP
4 series · 4 of 12 positions shown · non-contrast
Comparison: Previous exam(s).

CLINICAL DATA: Status post ultrasound-guided core biopsy of LEFT
breast mass.

EXAM:
3D DIAGNOSTIC LEFT MAMMOGRAM POST ULTRASOUND BIOPSY

[L ML synth-2D]
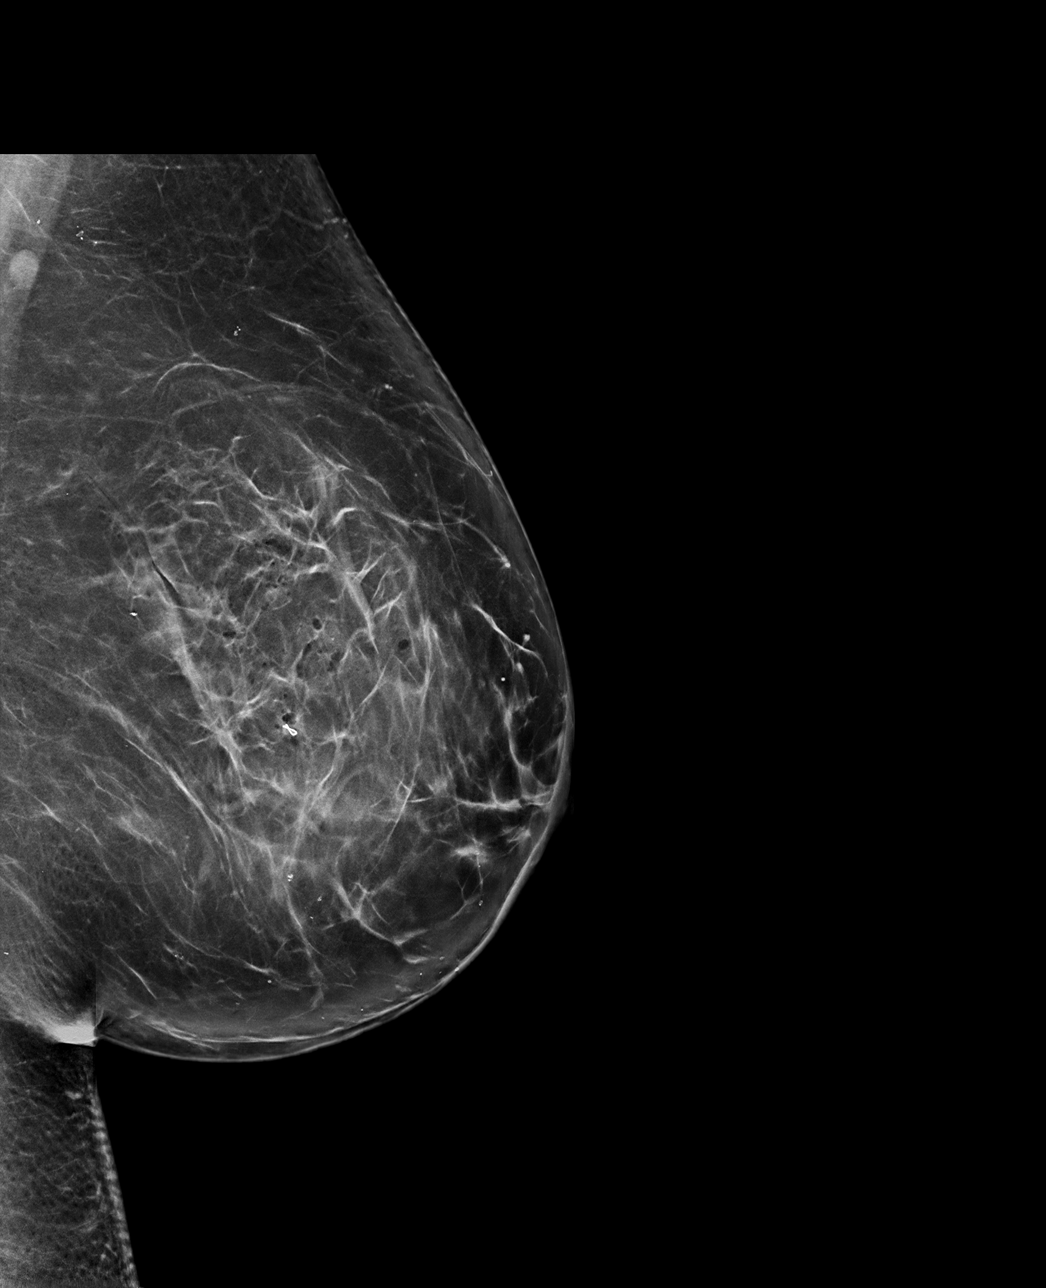

[L CC synth-2D]
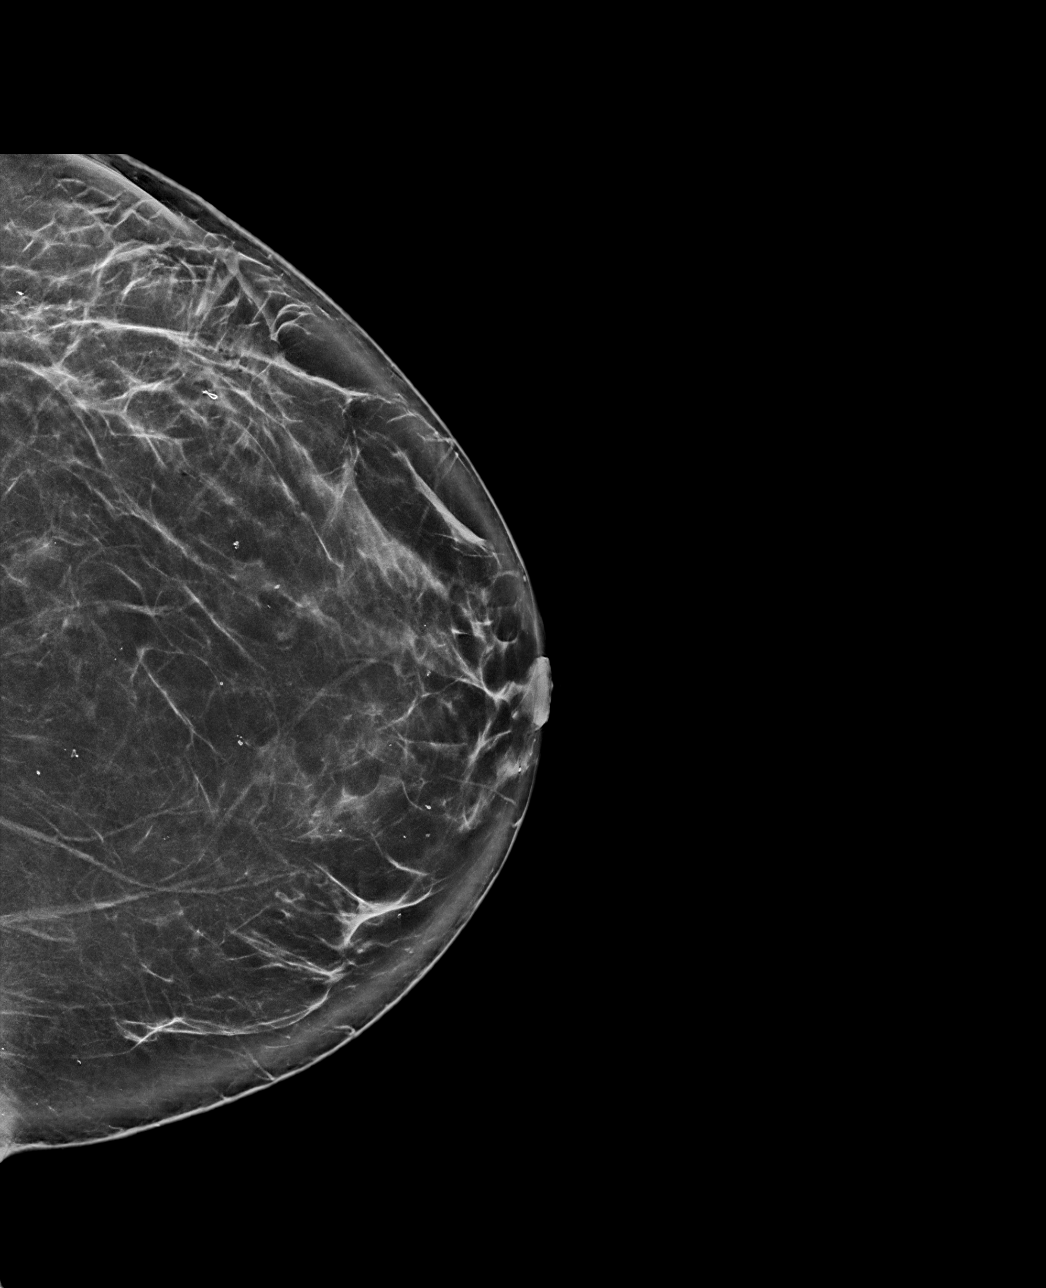

[L ML tomo · tomo slice 46/91.0]
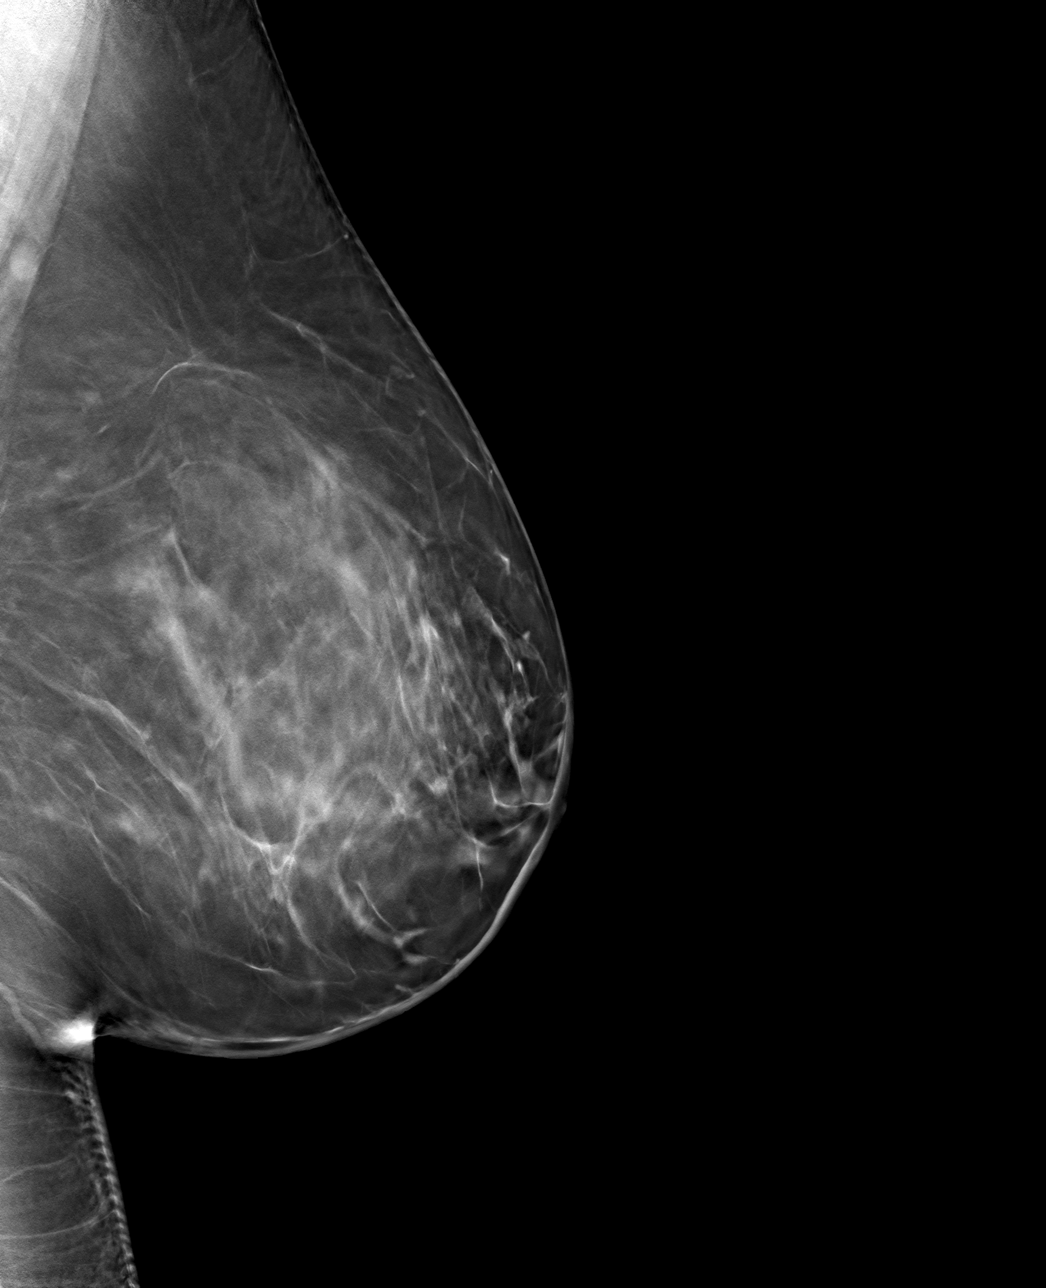

[L CC tomo · tomo slice 41/82.0]
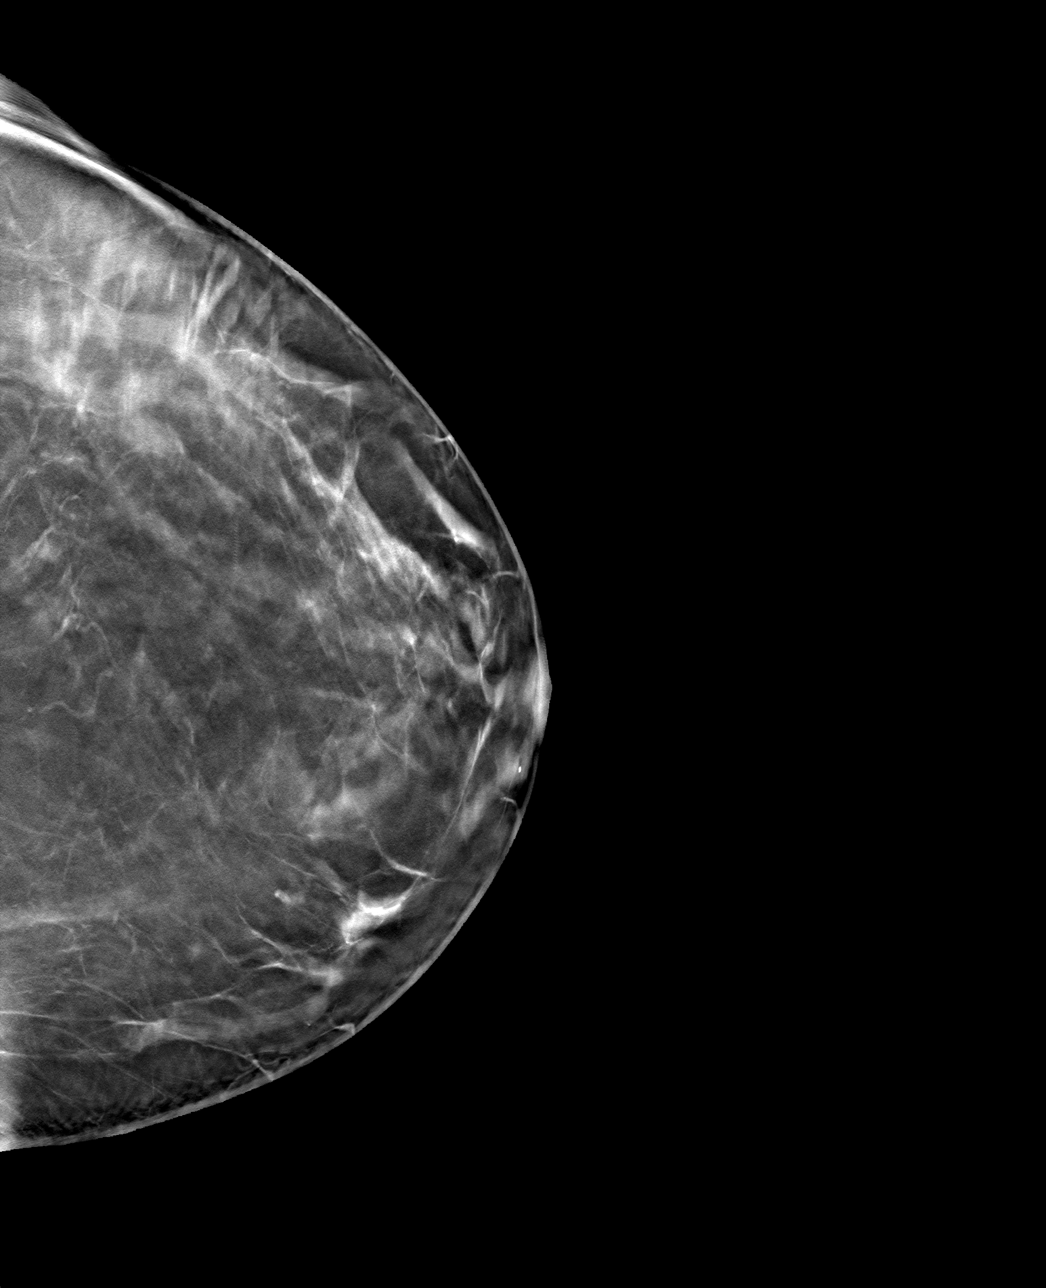

[4 of 12 positions shown; findings below may reference images not displayed]

FINDINGS: 3D Mammographic images were obtained following ultrasound guided
biopsy of mass in the 4 o'clock location of the LEFT breast and
placement of a ribbon shaped clip. The biopsy marking clip is in
expected position at the site of biopsy.
IMPRESSION: Appropriate positioning of the ribbon shaped biopsy marking clip at
the site of biopsy in the LOWER OUTER QUADRANT LEFT breast.

Final Assessment: Post Procedure Mammograms for Marker Placement

## 2024-03-29 DIAGNOSIS — Z1331 Encounter for screening for depression: Secondary | ICD-10-CM | POA: Diagnosis not present

## 2024-03-29 DIAGNOSIS — Z01419 Encounter for gynecological examination (general) (routine) without abnormal findings: Secondary | ICD-10-CM | POA: Diagnosis not present

## 2024-03-29 DIAGNOSIS — Z1231 Encounter for screening mammogram for malignant neoplasm of breast: Secondary | ICD-10-CM | POA: Diagnosis not present
